# Patient Record
Sex: Male | Born: 1985 | Race: Black or African American | Hispanic: No | Marital: Single | Smoking: Never smoker
Health system: Southern US, Community
[De-identification: ages and names within clinical notes are randomized; demographics above are authoritative.]

## PROBLEM LIST (undated history)

## (undated) DIAGNOSIS — IMO0002 Reserved for concepts with insufficient information to code with codable children: Secondary | ICD-10-CM

---

## 2013-04-29 ENCOUNTER — Emergency Department (HOSPITAL_COMMUNITY)
Admission: EM | Admit: 2013-04-29 | Discharge: 2013-04-29 | Disposition: A | Discharge status: Home / Self Care | Payer: 59 | Attending: Emergency Medicine | Admitting: Emergency Medicine

## 2013-04-29 ENCOUNTER — Encounter (HOSPITAL_COMMUNITY): Payer: Self-pay | Admitting: Emergency Medicine

## 2013-04-29 DIAGNOSIS — F17200 Nicotine dependence, unspecified, uncomplicated: Secondary | ICD-10-CM | POA: Insufficient documentation

## 2013-04-29 DIAGNOSIS — IMO0002 Reserved for concepts with insufficient information to code with codable children: Secondary | ICD-10-CM | POA: Insufficient documentation

## 2013-04-29 DIAGNOSIS — Y929 Unspecified place or not applicable: Secondary | ICD-10-CM | POA: Insufficient documentation

## 2013-04-29 DIAGNOSIS — Y9389 Activity, other specified: Secondary | ICD-10-CM | POA: Insufficient documentation

## 2013-04-29 DIAGNOSIS — M549 Dorsalgia, unspecified: Secondary | ICD-10-CM

## 2013-04-29 DIAGNOSIS — X500XXA Overexertion from strenuous movement or load, initial encounter: Secondary | ICD-10-CM | POA: Insufficient documentation

## 2013-04-29 MED ORDER — MORPHINE SULFATE 4 MG/ML IJ SOLN
6.0000 mg | Freq: Once | INTRAMUSCULAR | Status: AC
  Administered 2013-04-29: 6 mg via INTRAMUSCULAR
  Filled 2013-04-29: qty 2

## 2013-04-29 MED ORDER — CYCLOBENZAPRINE HCL 5 MG PO TABS: 5.0000 mg | ORAL_TABLET | Freq: Two times a day (BID) | ORAL | Status: DC | PRN

## 2013-04-29 MED ORDER — DIAZEPAM 5 MG/ML IJ SOLN
5.0000 mg | Freq: Once | INTRAMUSCULAR | Status: AC
  Administered 2013-04-29: 5 mg via INTRAVENOUS
  Filled 2013-04-29: qty 2

## 2013-04-29 MED ORDER — OXYCODONE-ACETAMINOPHEN 5-325 MG PO TABS: 1.0000 | ORAL_TABLET | ORAL | Status: DC | PRN

## 2013-04-29 MED ORDER — CYCLOBENZAPRINE HCL 10 MG PO TABS
5.0000 mg | ORAL_TABLET | Freq: Once | ORAL | Status: AC
  Administered 2013-04-29: 5 mg via ORAL
  Filled 2013-04-29: qty 1

## 2013-04-29 MED ORDER — KETOROLAC TROMETHAMINE 60 MG/2ML IM SOLN
60.0000 mg | Freq: Once | INTRAMUSCULAR | Status: AC
  Administered 2013-04-29: 60 mg via INTRAMUSCULAR
  Filled 2013-04-29: qty 2

## 2013-04-29 NOTE — ED Provider Notes (Signed)
CSN: [id number]     Arrival date & time 04/29/13  1535 History   First MD Initiated Contact with Patient 04/29/13 1537     Chief Complaint  Patient presents with  . Back Pain   (Consider location/radiation/quality/duration/timing/severity/associated sxs/prior Treatment) HPI Comments: Pt states that she has a history of back problems and today he was lifting a washing machine and he had onset of acute lower back pain:pt states that he needed help getting to the car because of the pain:pt states that he is not having problems with incontinence, numbness or weakness:pt has not tried anything at home:denies taking anything  The history is provided by the patient. No language interpreter was used.    History reviewed. No pertinent past medical history. History reviewed. No pertinent past surgical history. History reviewed. No pertinent family history. History  Substance Use Topics  . Smoking status: Current Every Day Smoker    Types: Cigarettes  . Smokeless tobacco: Not on file  . Alcohol Use: No    Review of Systems  Constitutional: Negative.   Respiratory: Negative.   Cardiovascular: Negative.     Allergies  Review of patient's allergies indicates no known allergies.  Home Medications  No current outpatient prescriptions on file. BP 116/68  Pulse 52  Temp(Src) 98.8 F (37.1 C) (Oral)  Resp 22  SpO2 98% Physical Exam  Nursing note and vitals reviewed. Constitutional: He is oriented to person, place, and time. He appears well-developed and well-nourished.  HENT:  Head: Normocephalic and atraumatic.  Eyes: Conjunctivae and EOM are normal.  Cardiovascular: Normal rate and regular rhythm.   Pulmonary/Chest: Effort normal and breath sounds normal.  Musculoskeletal: Normal range of motion.  Left lumbar paraspinal tenderness;pt has full rom of all extremities  Neurological: He is alert and oriented to person, place, and time. He exhibits normal muscle tone. Coordination  normal.  Skin: Skin is warm and dry.  Psychiatric: He has a normal mood and affect.    ED Course  Procedures (including critical care time) Labs Review Labs Reviewed - No data to display Imaging Review No results found.  EKG Interpretation   None       MDM   1. Back pain    Pt continues to have some pain:pt is neurologically intact:pt is ambulating without any problem:don't think pt needs imaging at this time    Teressa Lower, NP 04/29/13 1903

## 2013-04-29 NOTE — ED Notes (Signed)
Pt reports doing heavy lifting today and now having lower back pain. Had to assist pt out of car on arrival. Pt crying. Reports hx of back pain.

## 2013-04-29 NOTE — ED Provider Notes (Signed)
Medical screening examination/treatment/procedure(s) were performed by non-physician practitioner and as supervising physician I was immediately available for consultation/collaboration.  EKG Interpretation   None         Charles B. Bernette Mayers, MD 04/29/13 [phone]

## 2013-04-29 NOTE — ED Notes (Signed)
Pt undressed, in gown, on continuous pulse oximetry and blood pressure cuff; family at bedside 

## 2013-10-26 ENCOUNTER — Encounter (HOSPITAL_COMMUNITY): Payer: Self-pay | Admitting: Emergency Medicine

## 2013-10-26 ENCOUNTER — Emergency Department (HOSPITAL_COMMUNITY)
Admission: EM | Admit: 2013-10-26 | Discharge: 2013-10-26 | Disposition: A | Discharge status: Home / Self Care | Payer: BC Managed Care – PPO | Source: Home / Self Care | Attending: Emergency Medicine | Admitting: Emergency Medicine

## 2013-10-26 DIAGNOSIS — B2790 Infectious mononucleosis, unspecified without complication: Secondary | ICD-10-CM

## 2013-10-26 HISTORY — DX: Reserved for concepts with insufficient information to code with codable children: IMO0002

## 2013-10-26 LAB — CBC WITH DIFFERENTIAL/PLATELET
BASOS PCT: 0 % (ref 0–1)
Basophils Absolute: 0 10*3/uL (ref 0.0–0.1)
EOS ABS: 0 10*3/uL (ref 0.0–0.7)
EOS PCT: 0 % (ref 0–5)
HCT: 44.4 % (ref 39.0–52.0)
Hemoglobin: 15.6 g/dL (ref 13.0–17.0)
Lymphocytes Relative: 35 % (ref 12–46)
Lymphs Abs: 1.8 10*3/uL (ref 0.7–4.0)
MCH: 30.7 pg (ref 26.0–34.0)
MCHC: 35.1 g/dL (ref 30.0–36.0)
MCV: 87.4 fL (ref 78.0–100.0)
MONOS PCT: 14 % — AB (ref 3–12)
Monocytes Absolute: 0.7 10*3/uL (ref 0.1–1.0)
Neutro Abs: 2.6 10*3/uL (ref 1.7–7.7)
Neutrophils Relative %: 51 % (ref 43–77)
PLATELETS: 218 10*3/uL (ref 150–400)
RBC: 5.08 MIL/uL (ref 4.22–5.81)
RDW: 11.5 % (ref 11.5–15.5)
WBC: 5.2 10*3/uL (ref 4.0–10.5)

## 2013-10-26 LAB — POCT I-STAT, CHEM 8
BUN: 15 mg/dL (ref 6–23)
CALCIUM ION: 1.27 mmol/L — AB (ref 1.12–1.23)
Chloride: 98 mEq/L (ref 96–112)
Creatinine, Ser: 1.3 mg/dL (ref 0.50–1.35)
Glucose, Bld: 92 mg/dL (ref 70–99)
HCT: 51 % (ref 39.0–52.0)
Hemoglobin: 17.3 g/dL — ABNORMAL HIGH (ref 13.0–17.0)
Potassium: 4.4 mEq/L (ref 3.7–5.3)
Sodium: 143 mEq/L (ref 137–147)
TCO2: 37 mmol/L (ref 0–100)

## 2013-10-26 LAB — POCT INFECTIOUS MONO SCREEN: Mono Screen: POSITIVE — AB

## 2013-10-26 LAB — POCT RAPID STREP A: STREPTOCOCCUS, GROUP A SCREEN (DIRECT): NEGATIVE

## 2013-10-26 MED ORDER — PREDNISONE 20 MG PO TABS: 20.0000 mg | ORAL_TABLET | Freq: Two times a day (BID) | ORAL | Status: DC

## 2013-10-26 NOTE — ED Notes (Signed)
C/o lump on R side of neck onset 2-3 days ago.  When he woke up he said sore on the R side.  C/o dizziness onset yesterday evening.  Pt. has a low grade fever.  Pt. did not know he had a fever.

## 2013-10-26 NOTE — Discharge Instructions (Signed)
Infectious Mononucleosis  Infectious mononucleosis (mono) is a common germ (viral) infection in children, teenagers, and young adults.   CAUSES   Mono is an infection caused by the Epstein Barr virus. The virus is spread by close personal contact with someone who has the infection. It can be passed by contact with your saliva through things such as kissing or sharing drinking glasses. Sometimes, the infection can be spread from someone who does not appear sick but still spreads the virus (asymptomatic carrier state).   SYMPTOMS   The most common symptoms of Mono are:  · Sore throat.  · Headache.  · Fatigue.  · Muscle aches.  · Swollen glands.  · Fever.  · Poor appetite.  · Enlarged liver or spleen.  The less common symptoms can include:  · Rash.  · Feeling sick to your stomach (nauseous).  · Abdominal pain.  DIAGNOSIS   Mono is diagnosed by a blood test.   TREATMENT   Treatment of mono is usually at home. There is no medicine that cures this virus. Sometimes hospital treatment is needed in severe cases. Steroid medicine sometimes is needed if the swelling in the throat causes breathing or swallowing problems.   HOME CARE INSTRUCTIONS   · Drink enough fluids to keep your urine clear or pale yellow.  · Eat soft foods. Cool foods like popsicles or ice cream can soothe a sore throat.  · Only take over-the-counter or prescription medicines for pain, discomfort, or fever as directed by your caregiver. Children under 18 years of age should not take aspirin.  · Gargle salt water. This may help relieve your sore throat. Put 1 teaspoon (tsp) of salt in 1 cup of warm water. Sucking on hard candy may also help.  · Rest as needed.  · Start regular activities gradually after the fever is gone. Be sure to rest when tired.  · Avoid strenuous exercise or contact sports until your caregiver says it is okay. The liver and spleen could be seriously injured.  · Avoid sharing drinking glasses or kissing until your caregiver tells you  that you are no longer contagious.  SEEK MEDICAL CARE IF:   · Your fever is not gone after 7 days.  · Your activity level is not back to normal after 2 weeks.  · You have yellow coloring to eyes and skin (jaundice).  SEEK IMMEDIATE MEDICAL CARE IF:   · You have severe pain in the abdomen or shoulder.  · You have trouble swallowing or drooling.  · You have trouble breathing.  · You develop a stiff neck.  · You develop a severe headache.  · You cannot stop throwing up (vomiting).  · You have convulsions.  · You are confused.  · You have trouble with balance.  · You develop signs of body fluid loss (dehydration):  ¨ Weakness.  ¨ Sunken eyes.  ¨ Pale skin.  ¨ Dry mouth.  ¨ Rapid breathing or pulse.  MAKE SURE YOU:   · Understand these instructions.  · Will watch your condition.  · Will get help right away if you are not doing well or get worse.  Document Released: 04/24/2000 Document Revised: 07/20/2011 Document Reviewed: 02/21/2008  [employer]® Patient Information ©2015 [employer]. This information is not intended to replace advice given to you by your health care provider. Make sure you discuss any questions you have with your health care provider.

## 2013-10-26 NOTE — ED Provider Notes (Signed)
Chief Complaint   Chief Complaint  Patient presents with  . Neck Pain    History of Present Illness   Derek Mejia is a 28 year old male who's had a three-day history of tender swollen knots on the right side of his neck. He's felt dizzy and lightheaded and had a low-grade fever of up to 100.1. He denies any headache, nasal congestion, rhinorrhea, sore throat, stiff neck, cough, abdominal pain, nausea, vomiting, or diarrhea. He's been exposed to a coworker at work who has infectious mononucleosis. He denies any bites or stings. He's had no skin problems, ulcers, sores, pimples, or any type of skin infection.   Review of Systems   Other than as noted above, the patient denies any of the following symptoms. Systemic:  No fever, chills, sweats, myalgias, or headache. Eye:  No redness, pain or drainage. ENT:  No earache, nasal congestion, sneezing, rhinorrhea, sinus pressure, sinus pain, or post nasal drip. Lungs:  No cough, sputum production, wheezing, shortness of breath, or chest pain. GI:  No abdominal pain, nausea, vomiting, or diarrhea. Skin:  No rash.  PMFSH   Past medical history, family history, social history, meds, and allergies were reviewed. 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  Physical Exam     Vital signs:  BP 130/91  Pulse 66  Temp(Src) 100.1 F (37.8 C) (Oral)  Resp 16  SpO2 100% General:  Alert, in no distress. Phonation was normal, no drooling, and patient was able to handle secretions well.  Eye:  No conjunctival injection or drainage. Lids were normal. ENT:  TMs and canals were normal, without erythema or inflammation.  Nasal mucosa was clear and uncongested, without drainage.  Mucous membranes were moist.  Exam of pharynx was completely normal with no erythema, no swelling, no exudate, no ulcerations.  There were no oral ulcerations or lesions. There was no bulging of the tonsillar pillars, and the uvula was midline. Neck:  Supple, he has tender,  right posterior cervical lymphadenopathy, none the left, no adenopathy in the axillary, or groin areas. Lungs:  No respiratory distress.  Lungs were clear to auscultation, without wheezes, rales or rhonchi.  Breath sounds were clear and equal bilaterally.  Heart:  Regular rhythm, without gallops, murmers or rubs. Abdomen: Soft and nontender without organomegaly or mass. Neurological exam: Alert and oriented x3. Speech was clear and appropriate. Cranial nerves were intact. No pronator drift. Finger to nose was normal. Skin:  Clear, warm, and dry, without rash or lesions.  Labs   Results for orders placed during the hospital encounter of 10/26/13  CBC WITH DIFFERENTIAL      Result Value Ref Range   WBC 5.2  4.0 - 10.5 K/uL   RBC 5.08  4.22 - 5.81 MIL/uL   Hemoglobin 15.6  13.0 - 17.0 g/dL   HCT 91.444.4  78.239.0 - 95.652.0 %   MCV 87.4  78.0 - 100.0 fL   MCH 30.7  26.0 - 34.0 pg   MCHC 35.1  30.0 - 36.0 g/dL   RDW 21.311.5  08.611.5 - 57.815.5 %   Platelets 218  150 - 400 K/uL   Neutrophils Relative % 51  43 - 77 %   Neutro Abs 2.6  1.7 - 7.7 K/uL   Lymphocytes Relative 35  12 - 46 %   Lymphs Abs 1.8  0.7 - 4.0 K/uL   Monocytes Relative 14 (*) 3 - 12 %   Monocytes Absolute 0.7  0.1 - 1.0 K/uL  Eosinophils Relative 0  0 - 5 %   Eosinophils Absolute 0.0  0.0 - 0.7 K/uL   Basophils Relative 0  0 - 1 %   Basophils Absolute 0.0  0.0 - 0.1 K/uL  POCT INFECTIOUS MONO SCREEN      Result Value Ref Range   Mono Screen POSITIVE (*) NEGATIVE  POCT RAPID STREP A ([facility] ONLY)      Result Value Ref Range   Streptococcus, Group A Screen (Direct) NEGATIVE  NEGATIVE  POCT I-STAT, CHEM 8      Result Value Ref Range   Sodium 143  137 - 147 mEq/L   Potassium 4.4  3.7 - 5.3 mEq/L   Chloride 98  96 - 112 mEq/L   BUN 15  6 - 23 mg/dL   Creatinine, Ser 4.091.30  0.50 - 1.35 mg/dL   Glucose, Bld 92  70 - 99 mg/dL   Calcium, Ion 8.111.27 (*) 1.12 - 1.23 mmol/L   TCO2 37  0 - 100 mmol/L   Hemoglobin 17.3 (*) 13.0 - 17.0  g/dL   HCT 91.451.0  78.239.0 - 95.652.0 %   Assessment   The encounter diagnosis was Infectious mononucleosis.  Plan     1.  Meds:  The following meds were prescribed:   New Prescriptions   PREDNISONE (DELTASONE) 20 MG TABLET    Take 1 tablet (20 mg total) by mouth 2 (two) times daily.    2.  Patient Education/Counseling:  The patient was given appropriate handouts, self care instructions, and instructed in symptomatic relief, including hot saline gargles, throat lozenges, infectious precautions, and need to trade out toothbrush.    3.  Follow up:  The patient was told to follow up here if no better in 3 to 4 days, or sooner if becoming worse in any way, and given some red flag symptoms such as difficulty swallowing or breathing which would prompt immediate return.       Reuben Likesavid C Lynnet Hefley, MD 10/26/13 2150

## 2013-10-28 LAB — CULTURE, GROUP A STREP

## 2013-11-06 ENCOUNTER — Emergency Department (HOSPITAL_COMMUNITY)
Admission: EM | Admit: 2013-11-06 | Discharge: 2013-11-06 | Disposition: A | Discharge status: Home / Self Care | Payer: BC Managed Care – PPO | Source: Home / Self Care

## 2013-11-06 ENCOUNTER — Encounter (HOSPITAL_COMMUNITY): Payer: Self-pay | Admitting: Emergency Medicine

## 2013-11-06 DIAGNOSIS — B2790 Infectious mononucleosis, unspecified without complication: Secondary | ICD-10-CM

## 2013-11-06 DIAGNOSIS — Z09 Encounter for follow-up examination after completed treatment for conditions other than malignant neoplasm: Secondary | ICD-10-CM

## 2013-11-06 NOTE — ED Provider Notes (Signed)
CSN: [id number]     Arrival date & time 11/06/13  1105 History   First MD Initiated Contact with Patient 11/06/13 1157     Chief Complaint  Patient presents with  . Follow-up   (Consider location/radiation/quality/duration/timing/severity/associated sxs/prior Treatment) HPI Comments: 28 year old man was diagnosed with mononucleosis in the urgent care almost 2 weeks ago. He was advised to stay away from close contacts until he was consistently afebrile and feeling well. He states he is feeling much better, no fatigue, fever, sore throat or abdominal pain.   Past Medical History  Diagnosis Date  . 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 No pertinent past surgical history. Family History  Problem Relation Age of Onset  . Diabetes Father    History  Substance Use Topics  . Smoking status: Never Smoker   . Smokeless tobacco: Not on file  . Alcohol Use: Yes     Comment: socailly    Review of Systems  Constitutional: Negative.  Negative for fever, activity change, appetite change and fatigue.  HENT: Negative.  Negative for congestion, postnasal drip, rhinorrhea and sore throat.   Skin: Negative for rash.  All other systems reviewed and are negative.   Allergies  Review of patient's allergies indicates no known allergies.  Home Medications   Prior to Admission medications   Medication Sig Start Date End Date Taking? Authorizing Provider  cyclobenzaprine (FLEXERIL) 5 MG tablet Take 1 tablet (5 mg total) by mouth 2 (two) times daily as needed for muscle spasms. 04/29/13   Teressa LowerVrinda Pickering, NP  OVER THE COUNTER MEDICATION Take 1 tablet by mouth daily as needed (back pain).    Historical Provider, MD  oxyCODONE-acetaminophen (PERCOCET/ROXICET) 5-325 MG per tablet Take 1-2 tablets by mouth every 4 (four) hours as needed for severe pain. 04/29/13   Teressa LowerVrinda Pickering, NP  predniSONE (DELTASONE) 20 MG tablet Take 1 tablet (20 mg total) by mouth 2  (two) times daily. 10/26/13   Reuben Likesavid C Keller, MD   BP 127/63  Pulse 58  Temp(Src) 98.3 F (36.8 C) (Oral)  Resp 16  SpO2 98% Physical Exam  Nursing note and vitals reviewed. Constitutional: He is oriented to person, place, and time. He appears well-developed and well-nourished. He appears distressed.  HENT:  Mouth/Throat: Oropharynx is clear and moist. No oropharyngeal exudate.  Eyes: Conjunctivae and EOM are normal.  Neck: Normal range of motion. Neck supple.  Cardiovascular: Normal rate.   Pulmonary/Chest: Effort normal. No respiratory distress.  Abdominal: Soft. He exhibits no distension and no mass. There is no tenderness. There is no rebound and no guarding.  Lymphadenopathy:    He has no cervical adenopathy.  Neurological: He is alert and oriented to person, place, and time.  Skin: Skin is warm and dry. No rash noted.  Psychiatric: He has a normal mood and affect.    ED Course  Procedures (including critical care time) Labs Review Labs Reviewed - No data to display  Imaging Review No results found.   MDM   1. Follow-up exam   2. Mononucleosis     Asymptomatic. May resume nl activites.  For abdominal pain or fatigued get rechecked.    Hayden Rasmussenavid Mabe, NP 11/06/13 [id number]

## 2013-11-06 NOTE — Discharge Instructions (Signed)
Infectious Mononucleosis  Infectious mononucleosis (mono) is a common germ (viral) infection in children, teenagers, and young adults.   CAUSES   Mono is an infection caused by the Epstein Barr virus. The virus is spread by close personal contact with someone who has the infection. It can be passed by contact with your saliva through things such as kissing or sharing drinking glasses. Sometimes, the infection can be spread from someone who does not appear sick but still spreads the virus (asymptomatic carrier state).   SYMPTOMS   The most common symptoms of Mono are:  · Sore throat.  · Headache.  · Fatigue.  · Muscle aches.  · Swollen glands.  · Fever.  · Poor appetite.  · Enlarged liver or spleen.  The less common symptoms can include:  · Rash.  · Feeling sick to your stomach (nauseous).  · Abdominal pain.  DIAGNOSIS   Mono is diagnosed by a blood test.   TREATMENT   Treatment of mono is usually at home. There is no medicine that cures this virus. Sometimes hospital treatment is needed in severe cases. Steroid medicine sometimes is needed if the swelling in the throat causes breathing or swallowing problems.   HOME CARE INSTRUCTIONS   · Drink enough fluids to keep your urine clear or pale yellow.  · Eat soft foods. Cool foods like popsicles or ice cream can soothe a sore throat.  · Only take over-the-counter or prescription medicines for pain, discomfort, or fever as directed by your caregiver. Children under 18 years of age should not take aspirin.  · Gargle salt water. This may help relieve your sore throat. Put 1 teaspoon (tsp) of salt in 1 cup of warm water. Sucking on hard candy may also help.  · Rest as needed.  · Start regular activities gradually after the fever is gone. Be sure to rest when tired.  · Avoid strenuous exercise or contact sports until your caregiver says it is okay. The liver and spleen could be seriously injured.  · Avoid sharing drinking glasses or kissing until your caregiver tells you  that you are no longer contagious.  SEEK MEDICAL CARE IF:   · Your fever is not gone after 7 days.  · Your activity level is not back to normal after 2 weeks.  · You have yellow coloring to eyes and skin (jaundice).  SEEK IMMEDIATE MEDICAL CARE IF:   · You have severe pain in the abdomen or shoulder.  · You have trouble swallowing or drooling.  · You have trouble breathing.  · You develop a stiff neck.  · You develop a severe headache.  · You cannot stop throwing up (vomiting).  · You have convulsions.  · You are confused.  · You have trouble with balance.  · You develop signs of body fluid loss (dehydration):  ¨ Weakness.  ¨ Sunken eyes.  ¨ Pale skin.  ¨ Dry mouth.  ¨ Rapid breathing or pulse.  MAKE SURE YOU:   · Understand these instructions.  · Will watch your condition.  · Will get help right away if you are not doing well or get worse.  Document Released: 04/24/2000 Document Revised: 07/20/2011 Document Reviewed: 02/21/2008  [employer]® Patient Information ©2015 [employer]. This information is not intended to replace advice given to you by your health care provider. Make sure you discuss any questions you have with your health care provider.

## 2013-11-06 NOTE — ED Notes (Signed)
Here for follow up from 10/26/13 States he was dx with mono He was told to stay away from small children He wants to be cleared so he can see his son

## 2013-11-08 NOTE — ED Provider Notes (Signed)
Medical screening examination/treatment/procedure(s) were performed by non-physician practitioner and as supervising physician I was immediately available for consultation/collaboration.  Reza Crymes, M.D.  Laporcha Marchesi C Pegeen Stiger, MD 11/08/13 0749 

## 2014-09-06 ENCOUNTER — Emergency Department (HOSPITAL_COMMUNITY)
Admission: EM | Admit: 2014-09-06 | Discharge: 2014-09-06 | Disposition: A | Discharge status: Home / Self Care | Payer: BLUE CROSS/BLUE SHIELD | Attending: Emergency Medicine | Admitting: Emergency Medicine

## 2014-09-06 ENCOUNTER — Encounter (HOSPITAL_COMMUNITY): Payer: Self-pay

## 2014-09-06 DIAGNOSIS — Z9889 Other specified postprocedural states: Secondary | ICD-10-CM | POA: Insufficient documentation

## 2014-09-06 DIAGNOSIS — R200 Anesthesia of skin: Secondary | ICD-10-CM | POA: Insufficient documentation

## 2014-09-06 DIAGNOSIS — M549 Dorsalgia, unspecified: Secondary | ICD-10-CM | POA: Diagnosis present

## 2014-09-06 DIAGNOSIS — M545 Low back pain: Secondary | ICD-10-CM | POA: Diagnosis not present

## 2014-09-06 MED ORDER — METHOCARBAMOL 500 MG PO TABS: 500.0000 mg | ORAL_TABLET | Freq: Two times a day (BID) | ORAL | Status: DC | PRN

## 2014-09-06 MED ORDER — NAPROXEN 500 MG PO TABS: 500.0000 mg | ORAL_TABLET | Freq: Two times a day (BID) | ORAL | Status: DC

## 2014-09-06 NOTE — ED Notes (Signed)
Pt with lower back pain since yesterday.  Worse today at work.  Previous history and same and has disc problems.  No pain with urination.  Pt denies trauma.

## 2014-09-06 NOTE — ED Notes (Signed)
Pt states that he has not had insurance until recently; pt would like to get relief for pain for now and get referral to someone that can get down to the bottom of his chronic pain

## 2014-09-06 NOTE — Discharge Instructions (Signed)
Back Exercises °Back exercises help treat and prevent back injuries. The goal of back exercises is to increase the strength of your abdominal and back muscles and the flexibility of your back. These exercises should be started when you no longer have back pain. Back exercises include: °· Pelvic Tilt. Lie on your back with your knees bent. Tilt your pelvis until the lower part of your back is against the floor. Hold this position 5 to 10 sec and repeat 5 to 10 times. °· Knee to Chest. Pull first 1 knee up against your chest and hold for 20 to 30 seconds, repeat this with the other knee, and then both knees. This may be done with the other leg straight or bent, whichever feels better. °· Sit-Ups or Curl-Ups. Bend your knees 90 degrees. Start with tilting your pelvis, and do a partial, slow sit-up, lifting your trunk only 30 to 45 degrees off the floor. Take at least 2 to 3 seconds for each sit-up. Do not do sit-ups with your knees out straight. If partial sit-ups are difficult, simply do the above but with only tightening your abdominal muscles and holding it as directed. °· Hip-Lift. Lie on your back with your knees flexed 90 degrees. Push down with your feet and shoulders as you raise your hips a couple inches off the floor; hold for 10 seconds, repeat 5 to 10 times. °· Back arches. Lie on your stomach, propping yourself up on bent elbows. Slowly press on your hands, causing an arch in your low back. Repeat 3 to 5 times. Any initial stiffness and discomfort should lessen with repetition over time. °· Shoulder-Lifts. Lie face down with arms beside your body. Keep hips and torso pressed to floor as you slowly lift your head and shoulders off the floor. °Do not overdo your exercises, especially in the beginning. Exercises may cause you some mild back discomfort which lasts for a few minutes; however, if the pain is more severe, or lasts for more than 15 minutes, do not continue exercises until you see your caregiver.  Improvement with exercise therapy for back problems is slow.  °See your caregivers for assistance with developing a proper back exercise program. °Document Released: 06/04/2004 Document Revised: 07/20/2011 Document Reviewed: 02/26/2011 °ExitCare® Patient Information ©2015 [employer]. This information is not intended to replace advice given to you by your health care provider. Make sure you discuss any questions you have with your health care provider. °Back Pain, Adult °Low back pain is very common. About 1 in 5 people have back pain. The cause of low back pain is rarely dangerous. The pain often gets better over time. About half of people with a sudden onset of back pain feel better in just 2 weeks. About 8 in 10 people feel better by 6 weeks.  °CAUSES °Some common causes of back pain include: °· Strain of the muscles or ligaments supporting the spine. °· Wear and tear (degeneration) of the spinal discs. °· Arthritis. °· Direct injury to the back. °DIAGNOSIS °Most of the time, the direct cause of low back pain is not known. However, back pain can be treated effectively even when the exact cause of the pain is unknown. Answering your caregiver's questions about your overall health and symptoms is one of the most accurate ways to make sure the cause of your pain is not dangerous. If your caregiver needs more information, he or she may order lab work or imaging tests (X-rays or MRIs). However, even if imaging tests show changes in your back,   this usually does not require surgery. °HOME CARE INSTRUCTIONS °For many people, back pain returns. Since low back pain is rarely dangerous, it is often a condition that people can learn to manage on their own.  °· Remain active. It is stressful on the back to sit or stand in one place. Do not sit, drive, or stand in one place for more than 30 minutes at a time. Take short walks on level surfaces as soon as pain allows. Try to increase the length of time you walk each  day. °· Do not stay in bed. Resting more than 1 or 2 days can delay your recovery. °· Do not avoid exercise or work. Your body is made to move. It is not dangerous to be active, even though your back may hurt. Your back will likely heal faster if you return to being active before your pain is gone. °· Pay attention to your body when you  bend and lift. Many people have less discomfort when lifting if they bend their knees, keep the load close to their bodies, and avoid twisting. Often, the most comfortable positions are those that put less stress on your recovering back. °· Find a comfortable position to sleep. Use a firm mattress and lie on your side with your knees slightly bent. If you lie on your back, put a pillow under your knees. °· Only take over-the-counter or prescription medicines as directed by your caregiver. Over-the-counter medicines to reduce pain and inflammation are often the most helpful. Your caregiver may prescribe muscle relaxant drugs. These medicines help dull your pain so you can more quickly return to your normal activities and healthy exercise. °· Put ice on the injured area. °· Put ice in a plastic bag. °· Place a towel between your skin and the bag. °· Leave the ice on for 15-20 minutes, 03-04 times a day for the first 2 to 3 days. After that, ice and heat may be alternated to reduce pain and spasms. °· Ask your caregiver about trying back exercises and gentle massage. This may be of some benefit. °· Avoid feeling anxious or stressed. Stress increases muscle tension and can worsen back pain. It is important to recognize when you are anxious or stressed and learn ways to manage it. Exercise is a great option. °SEEK MEDICAL CARE IF: °· You have pain that is not relieved with rest or medicine. °· You have pain that does not improve in 1 week. °· You have new symptoms. °· You are generally not feeling well. °SEEK IMMEDIATE MEDICAL CARE IF:  °· You have pain that radiates from your back into  your legs. °· You develop new bowel or bladder control problems. °· You have unusual weakness or numbness in your arms or legs. °· You develop nausea or vomiting. °· You develop abdominal pain. °· You feel faint. °Document Released: 04/27/2005 Document Revised: 10/27/2011 Document Reviewed: 08/29/2013 °ExitCare® Patient Information ©2015 [employer]. This information is not intended to replace advice given to you by your health care provider. Make sure you discuss any questions you have with your health care provider. °Back Injury Prevention °Back injuries can be extremely painful and difficult to heal. After having one back injury, you are much more likely to experience another later on. It is important to learn how to avoid injuring or re-injuring your back. The following tips can help you to prevent a back injury. °PHYSICAL FITNESS °· Exercise regularly and try to develop good tone in your abdominal muscles. Your abdominal muscles provide a lot of the support needed by your back. °· Do   Do aerobic exercises (walking, jogging, biking, swimming) regularly.  Do exercises that increase balance and strength (tai chi, yoga) regularly. This can decrease your risk of falling and injuring your back.  Stretch before and after exercising.  Maintain a healthy weight. The more you weigh, the more stress is placed on your back. For every pound of weight, 10 times that amount of pressure is placed on the back. DIET  Talk to your caregiver about how much calcium and vitamin D you need per day. These nutrients help to prevent weakening of the bones (osteoporosis). Osteoporosis can cause broken (fractured) bones that lead to back pain.  Include good sources of calcium in your diet, such as dairy products, green, leafy vegetables, and products with calcium added (fortified).  Include good sources of vitamin D in your diet, such as milk and foods that are fortified with vitamin D.  Consider taking a nutritional  supplement or a multivitamin if needed.  Stop smoking if you smoke. POSTURE  Sit and stand up straight. Avoid leaning forward when you sit or hunching over when you stand.  Choose chairs with good low back (lumbar) support.  If you work at a desk, sit close to your work so you do not need to lean over. Keep your chin tucked in. Keep your neck drawn back and elbows bent at a right angle. Your arms should look like the letter "L."  Sit high and close to the steering wheel when you drive. Add a lumbar support to your car seat if needed.  Avoid sitting or standing in one position for too long. Take breaks to get up, stretch, and walk around at least once every hour. Take breaks if you are driving for long periods of time.  Sleep on your side with your knees slightly bent, or sleep on your back with a pillow under your knees. Do not sleep on your stomach. LIFTING, TWISTING, AND REACHING  Avoid heavy lifting, especially repetitive lifting. If you must do heavy lifting:  Stretch before lifting.  Work slowly.  Rest between lifts.  Use carts and dollies to move objects when possible.  Make several small trips instead of carrying 1 heavy load.  Ask for help when you need it.  Ask for help when moving big, awkward objects.  Follow these steps when lifting:  Stand with your feet shoulder-width apart.  Get as close to the object as you can. Do not try to pick up heavy objects that are far from your body.  Use handles or lifting straps if they are available.  Bend at your knees. Squat down, but keep your heels off the floor.  Keep your shoulders pulled back, your chin tucked in, and your back straight.  Lift the object slowly, tightening the muscles in your legs, abdomen, and buttocks. Keep the object as close to the center of your body as possible.  When you put a load down, use these same guidelines in reverse.  Do not:  Lift the object above your waist.  Twist at the waist  while lifting or carrying a load. Move your feet if you need to turn, not your waist.  Bend over without bending at your knees.  Avoid reaching over your head, across a table, or for an object on a high surface. OTHER TIPS  Avoid wet floors and keep sidewalks clear of ice to prevent falls.  Do not sleep on a mattress that is too soft or too hard.  Keep items that are  used frequently within easy reach.  Put heavier objects on shelves at waist level and lighter objects on lower or higher shelves.  Find ways to decrease your stress, such as exercise, massage, or relaxation techniques. Stress can build up in your muscles. Tense muscles are more vulnerable to injury.  Seek treatment for depression or anxiety if needed. These conditions can increase your risk of developing back pain. SEEK MEDICAL CARE IF:  You injure your back.  You have questions about diet, exercise, or other ways to prevent back injuries. MAKE SURE YOU:  Understand these instructions.  Will watch your condition.  Will get help right away if you are not doing well or get worse. Document Released: 06/04/2004 Document Revised: 07/20/2011 Document Reviewed: 06/08/2011 [facility] Patient Information 2015 [place], [place]. This information is not intended to replace advice given to you by your health care provider. Make sure you discuss any questions you have with your health care provider.   Emergency Department Resource Guide 1) Find a Doctor and Pay Out of Pocket Although you won't have to find out who is covered by your insurance plan, it is a good idea to ask around and get recommendations. You will then need to call the office and see if the doctor you have chosen will accept you as a new patient and what types of options they offer for patients who are self-pay. Some doctors offer discounts or will set up payment plans for their patients who do not have insurance, but you will need to ask so you aren't surprised when  you get to your appointment.  2) Contact Your Local Health Department Not all health departments have doctors that can see patients for sick visits, but many do, so it is worth a call to see if yours does. If you don't know where your local health department is, you can check in your phone book. The CDC also has a tool to help you locate your state's health department, and many state websites also have listings of all of their local health departments.  3) Find a [facility] If your illness is not likely to be very severe or complicated, you may want to try a walk in clinic. These are popping up all over the country in pharmacies, drugstores, and shopping centers. They're usually staffed by nurse practitioners or physician assistants that have been trained to treat common illnesses and complaints. They're usually fairly quick and inexpensive. However, if you have serious medical issues or chronic medical problems, these are probably not your best option.  No Primary Care Doctor: - Call Health Connect at  [phone] - they can help you locate a primary care doctor that  accepts your insurance, provides certain services, etc. - Physician Referral Service- [phone]  Chronic Pain Problems: Organization         Address  Phone   Notes  [facility]  [phone] Patients need to be referred by their primary care doctor.   Medication Assistance: Organization         Address  Phone   Notes  [facility] Medication [facility] [address] [place],  [address] [phone] --Must be a resident of [facility] -- Must have NO insurance coverage whatsoever (no Medicaid/ Medicare, etc.) -- The pt. MUST have a primary care doctor that directs their care regularly and follows them in the community   Med[facility]ist  [phone]   [address]  [phone]  Agencies that provide inexpensive medical care: Organization         Address  Phone    Notes  [place]  [phone]   [facility]    [phone]   [facility] [place], [place] [address] [phone]   [place] [address], [place] [phone]   Planned Parenthood    [phone]   [facility]    [phone]   [place] and [place], [place] Phone:  [phone], Fax:  [phone] Hours of Operation:  9 am - 6 pm, M-F.  Also accepts Medicaid/Medicare and self-pay.  [facility] for [place], [address], [place] Phone: [phone], Fax: [phone]. Hours of Operation:  8:30 am - 5:30 pm, M-F.  Also accepts Medicaid and self-pay.  [facility] [address], [place] Phone: [phone]   [place], [place], [place] [phone], Ext. [phone] Mondays & Thursdays: 7-9 AM.  First 15 patients are seen on a first come, first serve basis.    [place] Providers:  Organization         Address  Phone   Notes  [facility] [address], [place] [phone] Also accepts self-pay patients.  [facility] [address], [place]  [phone]   [place], [address], [place] [phone]   [facility] [address], [place] [phone]   Lucianne Lei [address], [place]   [phone] Only accepts [place] Access [place] patients after they have their name applied to their card.   Self-Pay (no insurance) in [facility]:  Organization         Address  Phone   Notes  Sickle Cell Patients, [facility] [place] [phone]   [facility] Urgent Care [place] [phone]   [facility] Pontes  Urgent Care [place]  [place], [place], [place] [phone]   [facility]/Dr. Osei-Bonsu  [address] [place] or [address], [place] [phone] Phone number for both [place] and [place] locations is the same.  Urgent Medical and [facility] [address], [place] [phone]   [facility] [address] [place] or [address] [phone]   [facility] [address] [place] [phone], phone; [phone], fax Sees patients 1st and 3rd Saturday of every month.  Must not qualify for public or private insurance (i.e. Medicaid, Medicare, [place] Health Choice, Veterans' Benefits)  Household income should be no more than 200% of the poverty level The clinic cannot treat you if you are pregnant or think you are pregnant  Sexually transmitted diseases are not treated at the clinic.    Dental Care: Organization         Address  Phone  Notes  [facility] Department of [facility] [place] [phone] Accepts children up to age 24 who are enrolled in [place] or [place]; pregnant women with a Medicaid card; and children who have applied for Medicaid or  [place] Health Choice, but were declined, whose parents can pay a reduced fee at time of service.  [facility] Department of [facility]  [address], [place] [phone] Accepts children up to age 15 who are enrolled in [place] or [place]; pregnant women with a Medicaid card; and children who have applied for Medicaid or [place] Health Choice, but were declined, whose parents can pay a reduced fee at time of service.  [place] Adult [facility] Access PROGRAM  [place] [phone] Patients are seen by appointment only. Walk-ins are not accepted. [place] will see patients 66 years of age  and older. Monday - Tuesday (8am-5pm) Most Wednesdays (8:30-5pm) $30 per visit, cash only  [facility] Adult Dental Access PROGRAM  [address], [facility] [phone] Patients are seen by appointment only. Walk-ins are not accepted. [place] will see patients 59 years of age and older. One Wednesday Evening (Monthly: Volunteer Based).  $30 per visit, cash only  [place]  [phone] for adults; Children under age 64, call Graduate Pediatric Dentistry at [phone]. Children aged 25-14, please call [phone] to request a pediatric application.  Dental services are provided in all areas of dental care including fillings, crowns and bridges, complete and partial dentures, implants, gum treatment, root canals, and extractions. Preventive care is also provided. Treatment is provided to both adults and children. Patients are selected via a lottery and there is often a waiting list.   [facility] [address] [place]  [phone] [email or web address]   [facility] [address] [place], [place] [phone], Ext. [phone] Second and Fourth Thursday of each month, opens at 6:30 AM; Clinic ends at 9 AM.  Patients are seen on a first-come first-served basis, and a limited number are seen during each clinic.   [facility]  [address] Hillard Danker [place], [place] [phone]   Eligibility Requirements You must have lived in [place], [place], or [place] counties for at least the last three months.   You cannot be eligible for state or federal sponsored [employer], including [employer], [place], or [employer].   You generally cannot be eligible for healthcare insurance through your employer.    How to apply: Eligibility screenings are held every Tuesday and Wednesday afternoon from 1:00 pm until 4:00 pm. You do not need an appointment for the interview!  [facility] [address], [place], [place]   [place]  [place] Department  [place]  [phone]    Behavioral Health Resources in the Community: Intensive Outpatient Programs Organization         Address  Phone  Notes  Brooks Boyd. [address], [place], [place] [phone]   [facility] Outpatient [address], [place], [place]   ADS: [facility] [address] [place], [place]   [place] [address]  [place], [place] or [phone]   Substance Abuse Resources Organization         Address  Phone  Notes  Alcohol and Drug Services  [phone]   [facility]  [phone]   [facility]  [phone]   Chinita Pester  [phone]   Residential & Outpatient Substance Abuse Program  [phone]  Psychological Services Organization         Address  Phone  Notes  [facility] [place]  [place]  [phone]   [place] [address], [place] or [phone]    Mobile Crisis Teams Organization         Address  Phone  Notes  [facility], Mobile Crisis Care Unit  [phone]   [facility]  [address] [place], [place]   [place] [address] [place] [phone]    Self-Help/Support Groups Organization         Address  Phone             Notes  [place] of [place] - variety of support groups  [place] Call for more information  Narcotics Anonymous (NA), [facility] [address], [employer] [place]  2 meetings at this location   Special educational needs teacher         Address  Phone  Notes  [facility] Residential Treatment [place],    [place]  [phone]   [facility]  [address] [place] [address], [place], [place]   [place], [place] [phone] Admissions: 8am-3pm M-F  Incentives Substance [place] [address],    [place], [place] [phone]   [facility] [address] [place], [place], [place]   [facility] [address]  [place], [place]   [facility] - Intensive Outpatient [address], [place], [place]   [facility] [address].,  [place], [place] [phone] or [phone]   [facility] (RTS) [address], [place], [place] Accepts Medicaid  [facility] [place] [address],  [place] [phone] Substance Abuse/Addiction Treatment   [facility] Organization         Address  Phone  Notes  [facility]  [phone]   Domenic Schwab, PhD [address] [place], [place]   [phone] or [phone]   [place], [place] [phone]   [facility] [address] [place], [place] [phone] Insurance/Medicaid/sponsorship through [facility] and Families [address]                                    [place], [place] [phone] [place] [address], [place] [phone]    Dr. Adele Schilder  [phone]   [facility] Dept. 1) [address],  2) [place] 3)  [place] [address], [place] [phone]  [phone]   [place] [phone] or [phone] (After Hours)

## 2014-09-06 NOTE — ED Provider Notes (Signed)
CSN: [id number]     Arrival date & time 09/06/14  1540 History   First MD Initiated Contact with Patient 09/06/14 1718     Chief Complaint  Patient presents with  . Back Pain   Derek Mejia is a 29 y.o. male with a history of disk herniation and chronic low back pain who presents to the emergency department complaining of chronic right low back pain and requesting help with a referral to her primary care provider. The patient reports that he is diagnosed with a disc herniation about 10 years ago after a back injury. He reports he had an MRI at the time and he was referred to physical therapy. He reports that over the past year he has not had insurance and his pain has worsened. He complains of 4 out of 10 shooting right low back pain that intermittently radiates into his right leg. He reports that after sitting on the toilet he can feel pain into his right leg. He reports intermittent numbness and tingling in his right leg with this pain. He currently denies any numbness or tingling. He reports he's been taking a fair back and body intermittently for his pain. He reports his pain is worse with movement and with sitting for long periods of time. The patient denies fevers, chills, weight loss, loss of bladder control, loss of bowel control, difficulty urinating, recent trauma history cancer, history of IV drug use, abdominal pain, nausea, vomiting, or urinary symptoms.  (Consider location/radiation/quality/duration/timing/severity/associated sxs/prior Treatment) HPI  Past Medical History  Diagnosis Date  . 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 No pertinent past surgical history. Family History  Problem Relation Age of Onset  . Diabetes Father    History  Substance Use Topics  . Smoking status: Never Smoker   . Smokeless tobacco: Not on file  . Alcohol Use: Yes     Comment: socailly    Review of Systems  Constitutional: Negative for fever and  chills.  HENT: Negative for congestion and sore throat.   Eyes: Negative for visual disturbance.  Respiratory: Negative for cough, shortness of breath and wheezing.   Cardiovascular: Negative for chest pain, palpitations and leg swelling.  Gastrointestinal: Negative for nausea, vomiting, abdominal pain and diarrhea.  Genitourinary: Negative for dysuria, frequency, hematuria and difficulty urinating.  Musculoskeletal: Positive for back pain. Negative for gait problem and neck pain.  Skin: Negative for rash.  Neurological: Negative for weakness, light-headedness and headaches.      Allergies  Review of patient's allergies indicates no known allergies.  Home Medications   Prior to Admission medications   Medication Sig Start Date End Date Taking? Authorizing Provider  Aspirin-Caffeine 500-32.5 MG TABS Take 2 tablets by mouth once as needed.   Yes Historical Provider, MD  cyclobenzaprine (FLEXERIL) 5 MG tablet Take 1 tablet (5 mg total) by mouth 2 (two) times daily as needed for muscle spasms. Patient not taking: Reported on 09/06/2014 04/29/13   Teressa Lower, NP  methocarbamol (ROBAXIN) 500 MG tablet Take 1 tablet (500 mg total) by mouth 2 (two) times daily as needed for muscle spasms. 09/06/14   Everlene Farrier, PA-C  naproxen (NAPROSYN) 500 MG tablet Take 1 tablet (500 mg total) by mouth 2 (two) times daily with a meal. 09/06/14   Everlene Farrier, PA-C  OVER THE COUNTER MEDICATION Take 1 tablet by mouth daily as needed (back pain).    Historical Provider, MD  oxyCODONE-acetaminophen (PERCOCET/ROXICET) 5-325 MG  per tablet Take 1-2 tablets by mouth every 4 (four) hours as needed for severe pain. Patient not taking: Reported on 09/06/2014 04/29/13   Teressa LowerVrinda Pickering, NP  predniSONE (DELTASONE) 20 MG tablet Take 1 tablet (20 mg total) by mouth 2 (two) times daily. Patient not taking: Reported on 09/06/2014 10/26/13   Reuben Likesavid C Keller, MD   BP 143/92 mmHg  Pulse 68  Temp(Src) 98.2 F (36.8 C)  (Oral)  Resp 16  SpO2 100% Physical Exam  Constitutional: He is oriented to person, place, and time. He appears well-developed and well-nourished. No distress.  Nontoxic appearing.  HENT:  Head: Normocephalic and atraumatic.  Mouth/Throat: Oropharynx is clear and moist.  Eyes: Conjunctivae are normal. Pupils are equal, round, and reactive to light. Right eye exhibits no discharge. Left eye exhibits no discharge.  Neck: Neck supple. No JVD present.  Cardiovascular: Normal rate, regular rhythm, normal heart sounds and intact distal pulses.  Exam reveals no gallop and no friction rub.   No murmur heard. Bilateral radial, and posterior tibialis pulses are intact.   Pulmonary/Chest: Effort normal and breath sounds normal. No respiratory distress. He has no wheezes. He has no rales.  Abdominal: Soft. There is no tenderness.  Musculoskeletal: He exhibits no edema or tenderness.  No lower extremities edema or tenderness. No midline back tenderness. No back edema, erythema or deformity. Patient has mild right low back tenderness to palpation. Patient is able to ambulate without difficulty or assistance. Patient is spontaneously moving all extremities in a coordinated fashion exhibiting good strength.  Lymphadenopathy:    He has no cervical adenopathy.  Neurological: He is alert and oriented to person, place, and time. He has normal reflexes. He displays normal reflexes. Coordination normal.  Bilateral patellar DTRs are intact. Sensation is intact in his bilateral lower extremities.  Skin: Skin is warm and dry. No rash noted. He is not diaphoretic. No erythema. No pallor.  Psychiatric: He has a normal mood and affect. His behavior is normal.  Nursing note and vitals reviewed.   ED Course  Procedures (including critical care time) Labs Review Labs Reviewed - No data to display  Imaging Review No results found.   EKG Interpretation None      Filed Vitals:   09/06/14 1553 09/06/14 1740   BP: 143/92 126/86  Pulse: 68 52  Temp: 98.2 F (36.8 C) 98.2 F (36.8 C)  TempSrc: Oral Oral  Resp: 16 16  SpO2: 100% 100%     MDM   Meds given in ED:  Medications - No data to display  New Prescriptions   METHOCARBAMOL (ROBAXIN) 500 MG TABLET    Take 1 tablet (500 mg total) by mouth 2 (two) times daily as needed for muscle spasms.   NAPROXEN (NAPROSYN) 500 MG TABLET    Take 1 tablet (500 mg total) by mouth 2 (two) times daily with a meal.    Final diagnoses:  Right low back pain, with sciatica presence unspecified    Patient with chronic back pain after old back injury.  No neurological deficits and normal neuro exam.  Patient can walk without difficulty or assistance.  No loss of bowel or bladder control.  No concern for cauda equina.  No fever, night sweats, weight loss, h/o cancer, IVDU.  RICE protocol and pain medicine indicated and discussed with patient. Requested patient to get follow up with a PCP. Back pain red flags discussed with patient with return precautions. I advised the patient to follow-up with their primary  care provider this week. I advised the patient to return to the emergency department with new or worsening symptoms or new concerns. The patient verbalized understanding and agreement with plan.      Everlene Farrier, PA-C 09/06/14 1758  Gwyneth Sprout, MD 09/06/14 [phone]

## 2017-06-28 ENCOUNTER — Encounter (HOSPITAL_COMMUNITY): Payer: Self-pay | Admitting: Emergency Medicine

## 2017-06-28 DIAGNOSIS — J111 Influenza due to unidentified influenza virus with other respiratory manifestations: Secondary | ICD-10-CM | POA: Insufficient documentation

## 2017-06-28 DIAGNOSIS — M549 Dorsalgia, unspecified: Secondary | ICD-10-CM | POA: Insufficient documentation

## 2017-06-28 DIAGNOSIS — Z79899 Other long term (current) drug therapy: Secondary | ICD-10-CM | POA: Insufficient documentation

## 2017-06-28 DIAGNOSIS — R05 Cough: Secondary | ICD-10-CM | POA: Diagnosis present

## 2017-06-28 MED ORDER — ACETAMINOPHEN 325 MG PO TABS
650.0000 mg | ORAL_TABLET | Freq: Once | ORAL | Status: AC | PRN
  Administered 2017-06-28: 650 mg via ORAL
  Filled 2017-06-28: qty 2

## 2017-06-28 NOTE — ED Triage Notes (Addendum)
Per EMS, patient from home, c/o cough, N/V, body aches and cramping x3 days.   18g L AC NS with EMS

## 2017-06-29 ENCOUNTER — Emergency Department (HOSPITAL_COMMUNITY)
Admission: EM | Admit: 2017-06-29 | Discharge: 2017-06-29 | Disposition: A | Discharge status: Home / Self Care | Payer: BLUE CROSS/BLUE SHIELD | Attending: Emergency Medicine | Admitting: Emergency Medicine

## 2017-06-29 DIAGNOSIS — M549 Dorsalgia, unspecified: Secondary | ICD-10-CM

## 2017-06-29 DIAGNOSIS — R69 Illness, unspecified: Secondary | ICD-10-CM

## 2017-06-29 DIAGNOSIS — G8929 Other chronic pain: Secondary | ICD-10-CM

## 2017-06-29 DIAGNOSIS — J111 Influenza due to unidentified influenza virus with other respiratory manifestations: Secondary | ICD-10-CM

## 2017-06-29 LAB — COMPREHENSIVE METABOLIC PANEL
ALBUMIN: 4.6 g/dL (ref 3.5–5.0)
ALK PHOS: 48 U/L (ref 38–126)
ALT: 43 U/L (ref 17–63)
ANION GAP: 13 (ref 5–15)
AST: 28 U/L (ref 15–41)
BUN: 12 mg/dL (ref 6–20)
CO2: 25 mmol/L (ref 22–32)
CREATININE: 1.26 mg/dL — AB (ref 0.61–1.24)
Calcium: 9.2 mg/dL (ref 8.9–10.3)
Chloride: 100 mmol/L — ABNORMAL LOW (ref 101–111)
GFR calc Af Amer: >60 mL/min (ref >60)
GFR calc non Af Amer: >60 mL/min (ref >60)
GLUCOSE: 96 mg/dL (ref 65–99)
Potassium: 3.7 mmol/L (ref 3.5–5.1)
SODIUM: 138 mmol/L (ref 135–145)
Total Bilirubin: 1.8 mg/dL — ABNORMAL HIGH (ref 0.3–1.2)
Total Protein: 8 g/dL (ref 6.5–8.1)

## 2017-06-29 LAB — CBC
HCT: 42.4 % (ref 39.0–52.0)
HEMOGLOBIN: 14.9 g/dL (ref 13.0–17.0)
MCH: 31.2 pg (ref 26.0–34.0)
MCHC: 35.1 g/dL (ref 30.0–36.0)
MCV: 88.9 fL (ref 78.0–100.0)
Platelets: 206 10*3/uL (ref 150–400)
RBC: 4.77 MIL/uL (ref 4.22–5.81)
RDW: 12 % (ref 11.5–15.5)
WBC: 7.9 10*3/uL (ref 4.0–10.5)

## 2017-06-29 LAB — URINALYSIS, ROUTINE W REFLEX MICROSCOPIC
Bilirubin Urine: NEGATIVE
Glucose, UA: NEGATIVE mg/dL
Hgb urine dipstick: NEGATIVE
Ketones, ur: 20 mg/dL — AB
Leukocytes, UA: NEGATIVE
Nitrite: NEGATIVE
Protein, ur: NEGATIVE mg/dL
SPECIFIC GRAVITY, URINE: 1.01 (ref 1.005–1.030)
pH: 5 (ref 5.0–8.0)

## 2017-06-29 LAB — LIPASE, BLOOD: Lipase: 23 U/L (ref 11–51)

## 2017-06-29 MED ORDER — ONDANSETRON HCL 4 MG PO TABS: 4.0000 mg | ORAL_TABLET | Freq: Four times a day (QID) | ORAL | 0 refills | Status: AC | PRN

## 2017-06-29 MED ORDER — OSELTAMIVIR PHOSPHATE 75 MG PO CAPS: 75.0000 mg | ORAL_CAPSULE | Freq: Two times a day (BID) | ORAL | 0 refills | Status: AC

## 2017-06-29 MED ORDER — KETOROLAC TROMETHAMINE 30 MG/ML IJ SOLN
30.0000 mg | Freq: Once | INTRAMUSCULAR | Status: AC
Start: 2017-06-29 — End: 2017-06-29
  Administered 2017-06-29: 30 mg via INTRAVENOUS
  Filled 2017-06-29: qty 1

## 2017-06-29 NOTE — ED Notes (Signed)
Bed: WLPT2 Expected date:  Expected time:  Means of arrival:  Comments: 

## 2017-06-29 NOTE — ED Provider Notes (Signed)
[facility]-EMERGENCY DEPT Provider Note   CSN: [id number] Arrival date & time: 06/28/17  2247     History   Chief Complaint Chief Complaint  Patient presents with  . Cough  . Emesis    HPI Derek Mejia is a 32 y.o. male.  The history is provided by the patient.  He has a history of herniated lumbar disc and comes in with onset approximately 24 hours ago of subjective fever, chills, sweats, body aches.  This evening, he vomited once.  There is been a cough which is nonproductive.  He has no known sick contacts, but was [facility] the [facility] event over the weekend with thousands of people present.  He currently rates his pain at 3/10.  He has received some IV fluids and oral acetaminophen prior to my seeing him.  Also, he relates chronic back pain and wishes to be referred to a neurosurgeon for evaluation.  Past Medical History:  Diagnosis Date  . Disc herniation    " protruding" L5 with nerve impingement    There are no active problems to display for this patient.   History reviewed. No pertinent surgical history.     Home Medications    Prior to Admission medications   Medication Sig Start Date End Date Taking? Authorizing Provider  Aspirin-Caffeine 500-32.5 MG TABS Take 2 tablets by mouth once as needed.    [provider]  cyclobenzaprine (FLEXERIL) 5 MG tablet Take 1 tablet (5 mg total) by mouth 2 (two) times daily as needed for muscle spasms. Patient not taking: Reported on 09/06/2014 04/29/13   Teressa LowerPickering, Vrinda, NP  methocarbamol (ROBAXIN) 500 MG tablet Take 1 tablet (500 mg total) by mouth 2 (two) times daily as needed for muscle spasms. 09/06/14   Everlene Farrieransie, William, PA-C  naproxen (NAPROSYN) 500 MG tablet Take 1 tablet (500 mg total) by mouth 2 (two) times daily with a meal. 09/06/14   Everlene Farrieransie, William, PA-C  OVER THE COUNTER MEDICATION Take 1 tablet by mouth daily as needed (back pain).    [provider]  oxyCODONE-acetaminophen  (PERCOCET/ROXICET) 5-325 MG per tablet Take 1-2 tablets by mouth every 4 (four) hours as needed for severe pain. Patient not taking: Reported on 09/06/2014 04/29/13   Teressa LowerPickering, Vrinda, NP  predniSONE (DELTASONE) 20 MG tablet Take 1 tablet (20 mg total) by mouth 2 (two) times daily. Patient not taking: Reported on 09/06/2014 10/26/13   Reuben LikesKeller, Mckala Pantaleon C, MD    Family History Family History  Problem Relation Age of Onset  . Diabetes Father     Social History Social History   Tobacco Use  . Smoking status: Never Smoker  Substance Use Topics  . Alcohol use: Yes    Comment: socailly  . Drug use: No     Allergies   Patient has no known allergies.   Review of Systems Review of Systems  All other systems reviewed and are negative.    Physical Exam Updated Vital Signs BP 129/90 (BP Location: Right Arm)   Pulse (!) 121   Temp 99.4 F (37.4 C)   Resp 18   Ht 5\' 7"  (1.702 m)   Wt 65.8 kg (145 lb)   SpO2 100%   BMI 22.71 kg/m   Physical Exam  Nursing note and vitals reviewed.  32 year old male, resting comfortably and in no acute distress. Vital signs are significant for tachycardia. Oxygen saturation is 100%, which is normal. Head is normocephalic and atraumatic. PERRLA, EOMI. Oropharynx is  clear. Neck is nontender and supple without adenopathy or JVD. Back is nontender and there is no CVA tenderness. Lungs are clear without rales, wheezes, or rhonchi. Chest is nontender. Heart has regular rate and rhythm without murmur. Abdomen is soft, flat, nontender without masses or hepatosplenomegaly and peristalsis is normoactive. Extremities have no cyanosis or edema, full range of motion is present. Skin is warm and dry without rash. Neurologic: Mental status is normal, cranial nerves are intact, there are no motor or sensory deficits.  ED Treatments / Results  Labs (all labs ordered are listed, but only abnormal results are displayed) Labs Reviewed  URINALYSIS, ROUTINE W  REFLEX MICROSCOPIC - Abnormal; Notable for the following components:      Result Value   Ketones, ur 20 (*)    All other components within normal limits  CBC  LIPASE, BLOOD  COMPREHENSIVE METABOLIC PANEL   Procedures Procedures (including critical care time)  Medications Ordered in ED Medications  acetaminophen (TYLENOL) tablet 650 mg (650 mg Oral Given 06/28/17 2300)     Initial Impression / Assessment and Plan / ED Course  I have reviewed the triage vital signs and the nursing notes.  Pertinent lab results that were available during my care of the patient were reviewed by me and considered in my medical decision making (see chart for details).  Influenza-like illness.  He is within the timeframe where antiviral medications may be helpful.  Risks and benefits of oseltamivir were discussed.  He is given prescription for oseltamivir and also ondansetron.  Referred to on-call neurosurgeon regarding his back pain.  Final Clinical Impressions(s) / ED Diagnoses   Final diagnoses:  Influenza-like illness  Chronic back pain, unspecified back location, unspecified back pain laterality    ED Discharge Orders        Ordered    oseltamivir (TAMIFLU) 75 MG capsule  Every 12 hours     06/29/17 0332    ondansetron (ZOFRAN) 4 MG tablet  Every 6 hours PRN     06/29/17 0332       Dione Booze, MD 06/29/17 [phone]

## 2017-06-29 NOTE — Discharge Instructions (Signed)
Drink plenty of fluids. Take acetaminophen or ibuprofen as needed for fever or aching. °

## 2018-05-17 ENCOUNTER — Emergency Department (HOSPITAL_COMMUNITY): Payer: BLUE CROSS/BLUE SHIELD

## 2018-05-17 ENCOUNTER — Encounter (HOSPITAL_COMMUNITY): Payer: Self-pay | Admitting: Emergency Medicine

## 2018-05-17 ENCOUNTER — Emergency Department (HOSPITAL_COMMUNITY)
Admission: EM | Admit: 2018-05-17 | Discharge: 2018-05-17 | Disposition: A | Discharge status: Home / Self Care | Payer: BLUE CROSS/BLUE SHIELD | Attending: Emergency Medicine | Admitting: Emergency Medicine

## 2018-05-17 ENCOUNTER — Other Ambulatory Visit: Payer: Self-pay

## 2018-05-17 DIAGNOSIS — M5441 Lumbago with sciatica, right side: Secondary | ICD-10-CM

## 2018-05-17 DIAGNOSIS — M545 Low back pain: Secondary | ICD-10-CM | POA: Diagnosis present

## 2018-05-17 MED ORDER — METHOCARBAMOL 500 MG PO TABS: 500.0000 mg | ORAL_TABLET | Freq: Every evening | ORAL | 0 refills | Status: AC | PRN

## 2018-05-17 MED ORDER — OXYCODONE-ACETAMINOPHEN 5-325 MG PO TABS
1.0000 | ORAL_TABLET | ORAL | Status: DC | PRN
  Administered 2018-05-17: 1 via ORAL
  Filled 2018-05-17: qty 1

## 2018-05-17 MED ORDER — METHOCARBAMOL 500 MG PO TABS
1000.0000 mg | ORAL_TABLET | Freq: Once | ORAL | Status: AC
  Administered 2018-05-17: 1000 mg via ORAL
  Filled 2018-05-17: qty 2

## 2018-05-17 MED ORDER — NAPROXEN 500 MG PO TABS: 500.0000 mg | ORAL_TABLET | Freq: Two times a day (BID) | ORAL | 0 refills | Status: AC

## 2018-05-17 MED ORDER — KETOROLAC TROMETHAMINE 15 MG/ML IJ SOLN
15.0000 mg | Freq: Once | INTRAMUSCULAR | Status: AC
  Administered 2018-05-17: 15 mg via INTRAMUSCULAR
  Filled 2018-05-17: qty 1

## 2018-05-17 MED ORDER — LIDOCAINE 5 % EX PTCH
1.0000 | MEDICATED_PATCH | CUTANEOUS | Status: DC
  Administered 2018-05-17: 1 via TRANSDERMAL
  Filled 2018-05-17: qty 1

## 2018-05-17 NOTE — ED Provider Notes (Signed)
[facility] Provider Note   CSN: [id number] Arrival date & time: 05/17/18  1101     History   Chief Complaint Chief Complaint  Patient presents with  . Back Pain  . Back Injury    HPI Derek Mejia is a 33 y.o. male presenting for evaluation of low back pain.  Patient states 2 days ago, he was jumping when he landed and felt something pop in his back.  He reports acute onset right low back pain which intermittently radiates down to his right leg.  He denies numbness or tingling.  He denies loss of bowel bladder control.  He denies fevers, chills, rash, history of cancer, history of IVDU.  Patient does have a history of disc herniation after an injury at 33 years old.  He has not followed up with orthopedics, neurosurgery or primary care about this.  Patient states he took 1 Aleve earlier this morning, has not taken anything else for pain.  Patient states symptoms improved yesterday when he went to a smile and had his spine realigned.  Pain is worse with movement, improved when he is laying flat.  He has no other medical problems, takes no medications daily.  HPI  Past Medical History:  Diagnosis Date  . Disc herniation    " protruding" L5 with nerve impingement    There are no active problems to display for this patient.   History reviewed. No pertinent surgical history.      Home Medications    Prior to Admission medications   Medication Sig Start Date End Date Taking? Authorizing Provider  Aspirin-Caffeine 500-32.5 MG TABS Take 2 tablets by mouth once as needed.    [provider]  methocarbamol (ROBAXIN) 500 MG tablet Take 1 tablet (500 mg total) by mouth at bedtime as needed for muscle spasms. 05/17/18   Qiana Landgrebe, PA-C  naproxen (NAPROSYN) 500 MG tablet Take 1 tablet (500 mg total) by mouth 2 (two) times daily with a meal. 05/17/18   Benjamim Harnish, PA-C  ondansetron (ZOFRAN) 4 MG tablet Take 1 tablet (4 mg total)  by mouth every 6 (six) hours as needed for nausea. 06/29/17   Dione BoozeGlick, David, MD  oseltamivir (TAMIFLU) 75 MG capsule Take 1 capsule (75 mg total) by mouth every 12 (twelve) hours. 06/29/17   Dione BoozeGlick, David, MD  OVER THE COUNTER MEDICATION Take 1 tablet by mouth daily as needed (back pain).    [provider]    Family History Family History  Problem Relation Age of Onset  . Diabetes Father     Social History Social History   Tobacco Use  . Smoking status: Never Smoker  . Smokeless tobacco: Never Used  Substance Use Topics  . Alcohol use: Yes    Comment: socailly  . Drug use: No     Allergies   Patient has no known allergies.   Review of Systems Review of Systems  Musculoskeletal: Positive for back pain.  All other systems reviewed and are negative.    Physical Exam Updated Vital Signs BP (!) 121/99   Pulse (!) 57   Temp 98.3 F (36.8 C) (Oral)   Resp 16   Ht 5\' 7"  (1.702 m)   Wt 68 kg   SpO2 100%   BMI 23.49 kg/m   Physical Exam Vitals signs and nursing note reviewed.  Constitutional:      General: He is not in acute distress.    Appearance: He is well-developed.  Comments: Appears nontoxic  HENT:     Head: Normocephalic and atraumatic.  Eyes:     Conjunctiva/sclera: Conjunctivae normal.     Pupils: Pupils are equal, round, and reactive to light.  Neck:     Musculoskeletal: Normal range of motion and neck supple.  Cardiovascular:     Rate and Rhythm: Normal rate and regular rhythm.  Pulmonary:     Effort: Pulmonary effort is normal. No respiratory distress.     Breath sounds: Normal breath sounds. No wheezing.  Abdominal:     General: There is no distension.     Palpations: Abdomen is soft. There is no mass.     Tenderness: There is no abdominal tenderness. There is no guarding or rebound.  Musculoskeletal: Normal range of motion.        General: Tenderness present.     Comments: TTP of R low back musculature. Increased pain with SLR of R  leg. Patellar reflexes intact. Sensation of lower extremities intact. Pedal pulses intact. No saddle paresthesias.   Skin:    General: Skin is warm and dry.     Capillary Refill: Capillary refill takes less than 2 seconds.  Neurological:     General: No focal deficit present.     Mental Status: He is alert and oriented to person, place, and time.      ED Treatments / Results  Labs (all labs ordered are listed, but only abnormal results are displayed) Labs Reviewed - No data to display  EKG None  Radiology Dg Lumbar Spine Complete  Result Date: 05/17/2018 CLINICAL DATA:  Chronic right knee pain EXAM: LUMBAR SPINE - COMPLETE 4+ VIEW COMPARISON:  None. FINDINGS: There is no evidence of lumbar spine fracture. Alignment is normal. Intervertebral disc spaces are maintained. IMPRESSION: Negative. Electronically Signed   By: Elige KoHetal  Patel   On: 05/17/2018 14:34    Procedures Procedures (including critical care time)  Medications Ordered in ED Medications  oxyCODONE-acetaminophen (PERCOCET/ROXICET) 5-325 MG per tablet 1 tablet (1 tablet Oral Given 05/17/18 1222)  lidocaine (LIDODERM) 5 % 1 patch (1 patch Transdermal Patch Applied 05/17/18 1505)  methocarbamol (ROBAXIN) tablet 1,000 mg (1,000 mg Oral Given 05/17/18 1505)  ketorolac (TORADOL) 15 MG/ML injection 15 mg (15 mg Intramuscular Given 05/17/18 1533)     Initial Impression / Assessment and Plan / ED Course  I have reviewed the triage vital signs and the nursing notes.  Pertinent labs & imaging results that were available during my care of the patient were reviewed by me and considered in my medical decision making (see chart for details).     Patient presenting for evaluation of low back pain.  Physical exam reassuring, neurovascularly intact.  However, as sxs began after landing andhaving acute pain, will order xray for further evaluation.  Xray viewed and interpreted by me, no fx or dislocation.  Pain is reproducible with palpation  of the musculature.  Likely musculoskeletal.  Doubt vertebral injury, infection, spinal cord compression, myelopathy, or cauda equina syndrome.  Will treat symptomatically with NSAIDs, muscle relaxers, muscle creams.  Patient given information to follow-up with primary care.  At this time, patient appears safe for discharge.  Return precautions given.  Patient states he understands agrees plan.   Final Clinical Impressions(s) / ED Diagnoses   Final diagnoses:  Acute right-sided low back pain with right-sided sciatica    ED Discharge Orders         Ordered    naproxen (NAPROSYN) 500 MG tablet  2  times daily with meals     05/17/18 1518    methocarbamol (ROBAXIN) 500 MG tablet  At bedtime PRN     05/17/18 1518           Jonothan Heberle, PA-C 05/17/18 1559    Mesner, Barbara Cower, MD 05/19/18 9528

## 2018-05-17 NOTE — ED Notes (Signed)
Patient transported to X-ray 

## 2018-05-17 NOTE — ED Notes (Signed)
Patient verbalizes understanding of discharge instructions. Opportunity for questioning and answers were provided. Armband removed by staff, pt discharged from ED.  

## 2018-05-17 NOTE — ED Notes (Signed)
Pt states he has ride home after robaxin administration.

## 2018-05-17 NOTE — ED Triage Notes (Signed)
Pt reports being on a 2 days ago cruise, while playing basketball he came down on his legs and felt a pop. Pt is standing due to back spasms. A/O at triage.

## 2018-05-17 NOTE — Discharge Instructions (Addendum)
Take naproxen 2 times a day with meals.  Do not take other anti-inflammatories at the same time (Advil, Motrin, ibuprofen, Aleve). You may supplement with Tylenol if you need further pain control. Use Robaxin as needed for muscle stiffness or soreness. Have caution, as this may make you tired or groggy. Do not drive or operate heavy machinery while taking this medication.  Use muscle creams (bengay, icy hot, salonpas) as needed for pain.  Follow up with a primary care doctor. There is information for 2 primary care clinics listed below.  Return to the ER if you develop high fevers, numbness, loss of bowel or bladder control, or any new or concerning symptoms.

## 2018-11-22 ENCOUNTER — Ambulatory Visit: Payer: Self-pay | Admitting: *Deleted

## 2018-11-22 ENCOUNTER — Telehealth: Payer: Self-pay | Admitting: *Deleted

## 2018-11-22 DIAGNOSIS — Z20822 Contact with and (suspected) exposure to covid-19: Secondary | ICD-10-CM

## 2018-11-22 NOTE — Telephone Encounter (Signed)
Patient was exposed to + family member- see triage note.

## 2018-11-22 NOTE — Telephone Encounter (Signed)
Patient has had high risk travel and exposure to + family member during travel. His employer will not let him return to work without testing. Patient does not have PCP. Patient advised that he needs to establish care for medical care in the future.  Reason for Disposition . [1] COVID-19 EXPOSURE (Close Contact) within last 14 days AND [2] needs COVID-19 lab test to return to work AND [3] NO symptoms  Answer Assessment - Initial Assessment Questions 1. CLOSE CONTACT: "Who is the person with the confirmed or suspected COVID-19 infection that you were exposed to?"     Family member 2. PLACE of CONTACT: "Where were you when you were exposed to COVID-19?" (e.g., home, school, medical waiting room; which city?)     Visiting home in [place] 3. TYPE of CONTACT: "How much contact was there?" (e.g., sitting next to, live in same house, work in same office, same building)     Visiting the home 4. DURATION of CONTACT: "How long were you in contact with the COVID-19 patient?" (e.g., a few seconds, passed by person, a few minutes, live with the patient)     2 hours contact 5. DATE of CONTACT: "When did you have contact with a COVID-19 patient?" (e.g., how many days ago)     7/4 6. TRAVEL: "Have you traveled out of the country recently?" If so, "When and where?"     * Also ask about out-of-state travel, since the CDC has identified some high-risk cities for community spread in the [place].     * Note: Travel becomes less relevant if there is widespread community transmission where the patient lives.     [place] - high risk travel 7. COMMUNITY SPREAD: "Are there lots of cases of COVID-19 (community spread) where you live?" (See public health department website, if unsure)       community spread 8. SYMPTOMS: "Do you have any symptoms?" (e.g., fever, cough, breathing difficulty)     No  9. PREGNANCY OR POSTPARTUM: "Is there any chance you are pregnant?" "When was your last menstrual period?" "Did you deliver in  the last 2 weeks?"     n/a 10. HIGH RISK: "Do you have any heart or lung problems? Do you have a weak immune system?" (e.g., CHF, COPD, asthma, HIV positive, chemotherapy, renal failure, diabetes mellitus, sickle cell anemia)       no  Protocols used: CORONAVIRUS (COVID-19) EXPOSURE-A-AH

## 2018-11-23 ENCOUNTER — Other Ambulatory Visit: Payer: BLUE CROSS/BLUE SHIELD

## 2018-11-23 ENCOUNTER — Other Ambulatory Visit: Payer: Self-pay | Admitting: Internal Medicine

## 2018-11-23 DIAGNOSIS — Z20822 Contact with and (suspected) exposure to covid-19: Secondary | ICD-10-CM

## 2018-11-27 LAB — NOVEL CORONAVIRUS, NAA: SARS-CoV-2, NAA: NOT DETECTED

## 2019-02-03 ENCOUNTER — Other Ambulatory Visit: Payer: Self-pay

## 2019-02-03 DIAGNOSIS — Z20822 Contact with and (suspected) exposure to covid-19: Secondary | ICD-10-CM

## 2019-02-04 LAB — NOVEL CORONAVIRUS, NAA: SARS-CoV-2, NAA: NOT DETECTED

## 2019-02-07 ENCOUNTER — Other Ambulatory Visit: Payer: Self-pay

## 2019-02-07 DIAGNOSIS — Z20822 Contact with and (suspected) exposure to covid-19: Secondary | ICD-10-CM

## 2019-02-08 LAB — NOVEL CORONAVIRUS, NAA: SARS-CoV-2, NAA: NOT DETECTED

## 2019-03-02 ENCOUNTER — Other Ambulatory Visit: Payer: Self-pay

## 2019-03-02 DIAGNOSIS — Z20822 Contact with and (suspected) exposure to covid-19: Secondary | ICD-10-CM

## 2019-03-04 LAB — NOVEL CORONAVIRUS, NAA: SARS-CoV-2, NAA: NOT DETECTED

## 2020-04-24 IMAGING — CR DG LUMBAR SPINE COMPLETE 4+V
5 series · 5 of 5 positions shown · non-contrast
Comparison: None.

CLINICAL DATA: Chronic right knee pain

EXAM:
LUMBAR SPINE - COMPLETE 4+ VIEW

[l-spine ap]
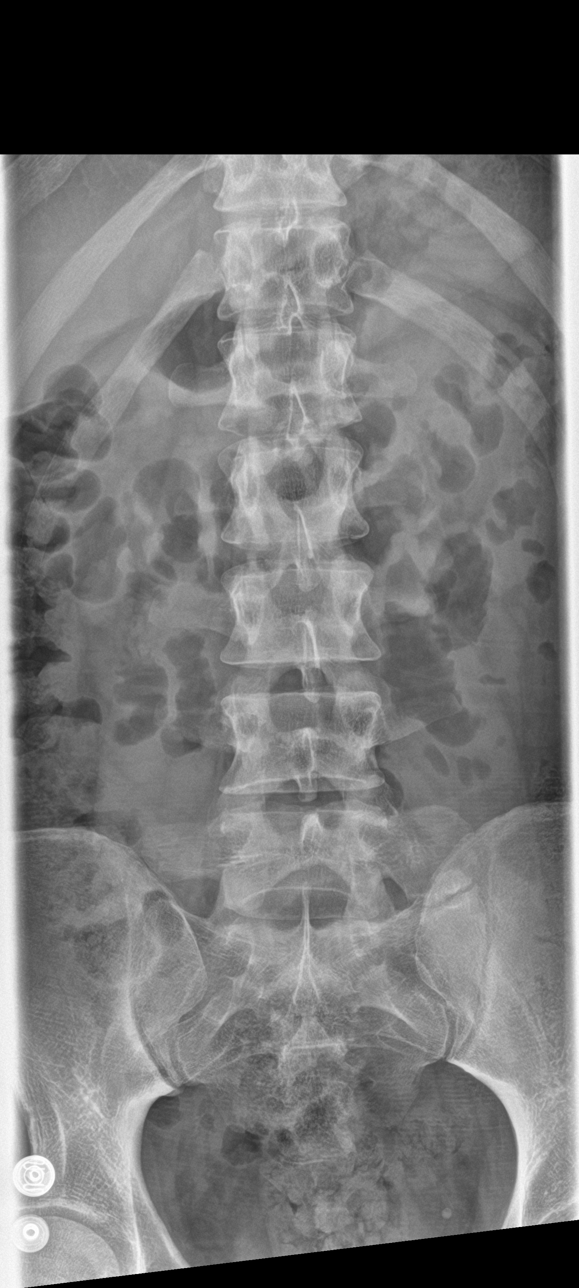

[l-spine obl (1 of 2)]
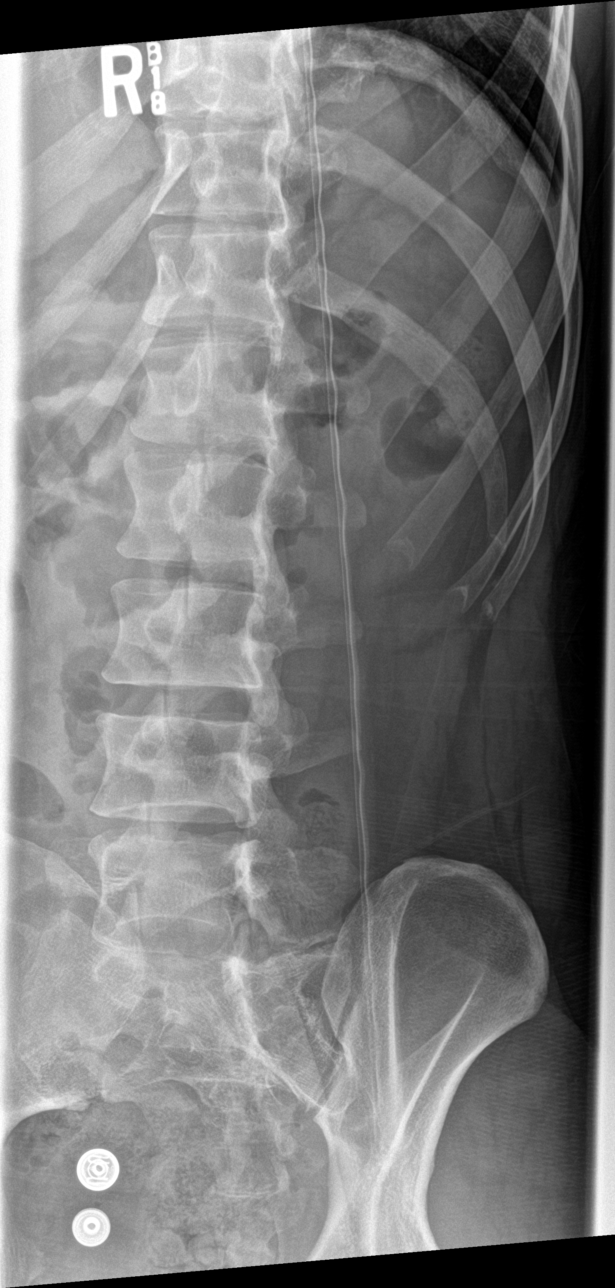

[l-spine obl (2 of 2)]
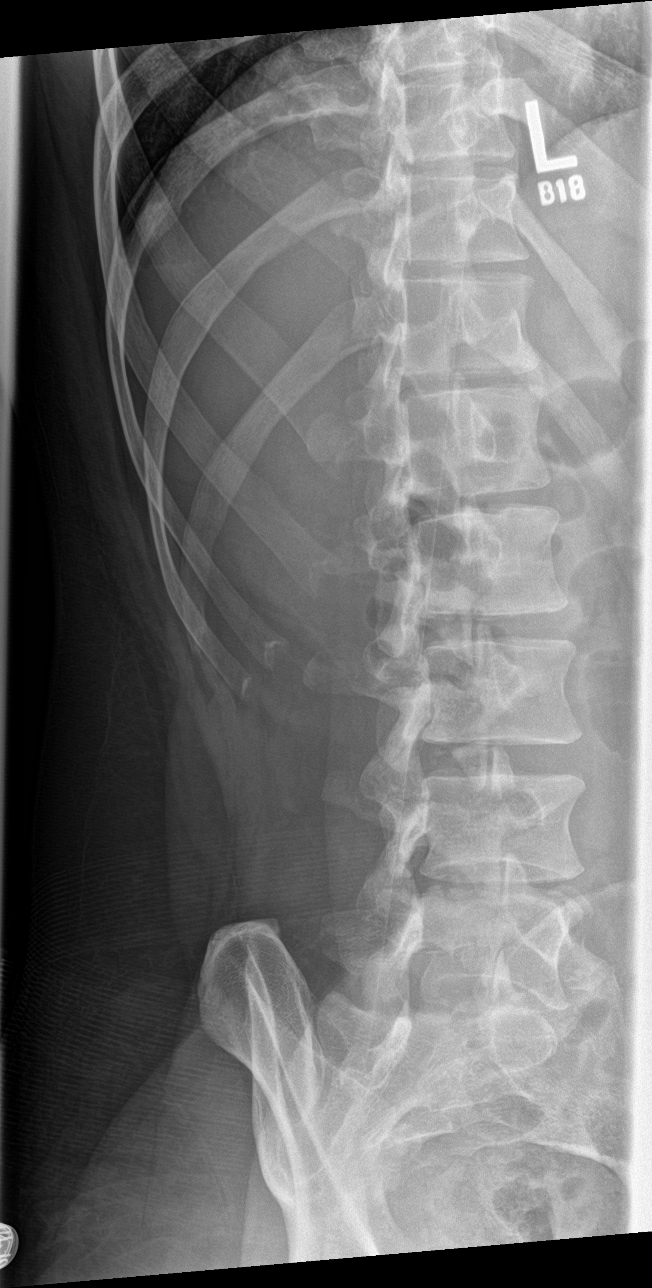

[l-spine lat]
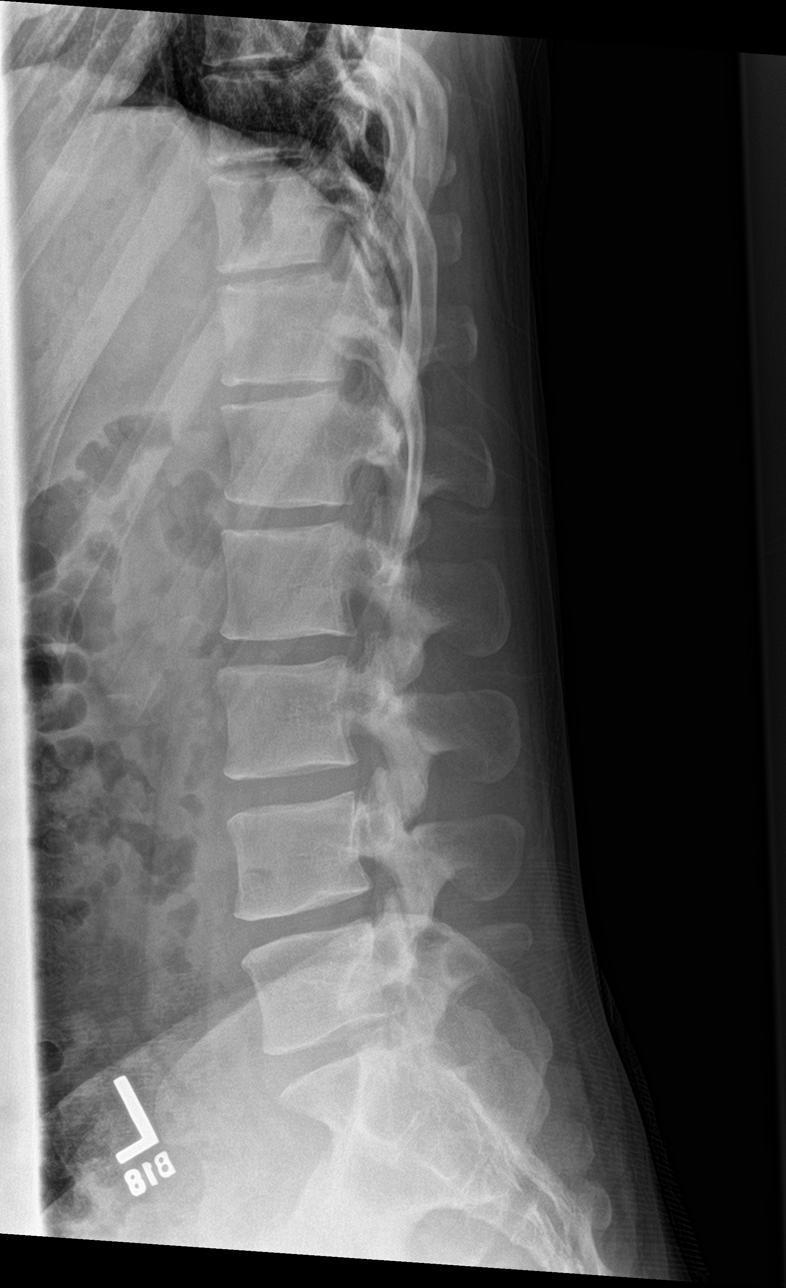

[l-spine spot]
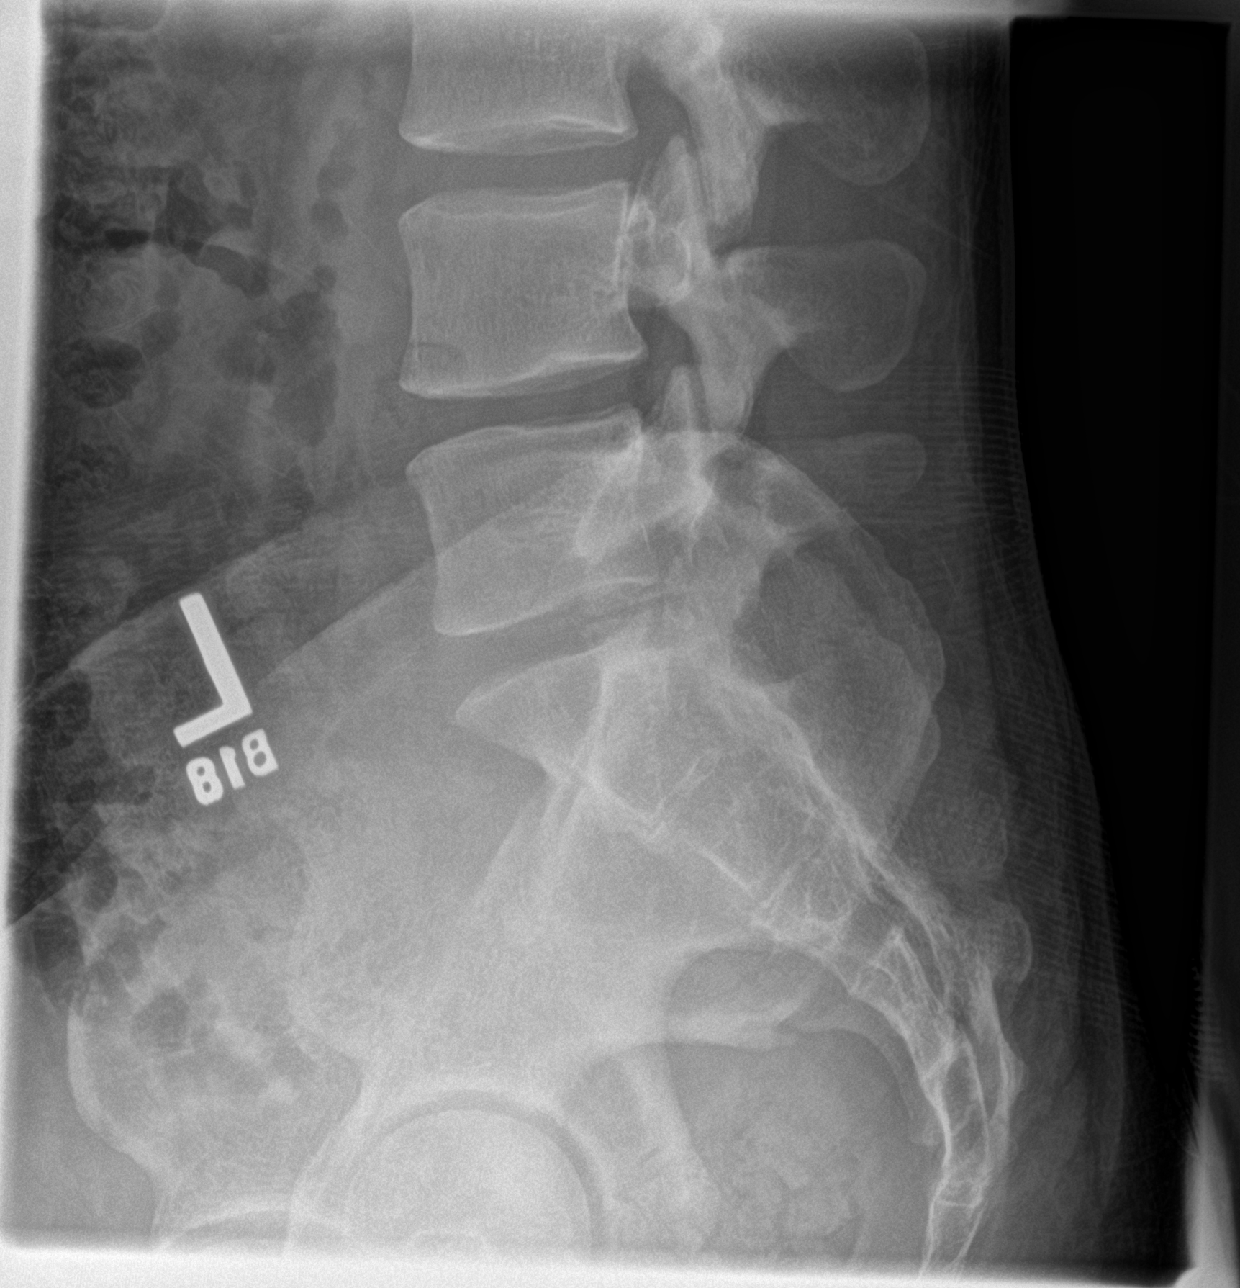

[5 of 5 positions shown; findings below may reference images not displayed]

FINDINGS: There is no evidence of lumbar spine fracture. Alignment is normal.
Intervertebral disc spaces are maintained.
IMPRESSION: Negative.
